# Patient Record
Sex: Male | Born: 1955 | Hispanic: Yes | Marital: Married | Smoking: Former smoker
Health system: Southern US, Community
[De-identification: ages and names within clinical notes are randomized; demographics above are authoritative.]

---

## 2018-12-02 ENCOUNTER — Emergency Department: Payer: BLUE CROSS/BLUE SHIELD

## 2018-12-02 ENCOUNTER — Encounter: Payer: Self-pay | Admitting: Emergency Medicine

## 2018-12-02 ENCOUNTER — Emergency Department
Admission: EM | Admit: 2018-12-02 | Discharge: 2018-12-02 | Disposition: A | Discharge status: Home / Self Care | Payer: BLUE CROSS/BLUE SHIELD | Attending: Emergency Medicine | Admitting: Emergency Medicine

## 2018-12-02 DIAGNOSIS — M47814 Spondylosis without myelopathy or radiculopathy, thoracic region: Secondary | ICD-10-CM

## 2018-12-02 DIAGNOSIS — Y998 Other external cause status: Secondary | ICD-10-CM | POA: Insufficient documentation

## 2018-12-02 DIAGNOSIS — Y92410 Unspecified street and highway as the place of occurrence of the external cause: Secondary | ICD-10-CM | POA: Insufficient documentation

## 2018-12-02 DIAGNOSIS — M545 Low back pain: Secondary | ICD-10-CM | POA: Diagnosis present

## 2018-12-02 DIAGNOSIS — Y9389 Activity, other specified: Secondary | ICD-10-CM | POA: Diagnosis not present

## 2018-12-02 DIAGNOSIS — M47816 Spondylosis without myelopathy or radiculopathy, lumbar region: Secondary | ICD-10-CM | POA: Diagnosis not present

## 2018-12-02 MED ORDER — MELOXICAM 15 MG PO TABS: 15.0000 mg | ORAL_TABLET | Freq: Every day | ORAL | 0 refills | Status: AC

## 2018-12-02 MED ORDER — KETOROLAC TROMETHAMINE 30 MG/ML IJ SOLN
30.0000 mg | Freq: Once | INTRAMUSCULAR | Status: AC
  Administered 2018-12-02: 30 mg via INTRAMUSCULAR
  Filled 2018-12-02: qty 1
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
## 2018-12-02 NOTE — Discharge Instructions (Signed)
Begin taking medication as directed 1 a day.  Follow-up with [facility] acute care if any continued problems.

## 2019-07-30 ENCOUNTER — Emergency Department
Admission: EM | Admit: 2019-07-30 | Discharge: 2019-07-30 | Disposition: A | Discharge status: Home / Self Care | Payer: BC Managed Care – PPO | Attending: Emergency Medicine | Admitting: Emergency Medicine

## 2019-07-30 ENCOUNTER — Other Ambulatory Visit: Payer: Self-pay

## 2019-07-30 ENCOUNTER — Encounter: Payer: Self-pay | Admitting: Emergency Medicine

## 2019-07-30 ENCOUNTER — Emergency Department: Payer: BC Managed Care – PPO

## 2019-07-30 DIAGNOSIS — U071 COVID-19: Secondary | ICD-10-CM | POA: Insufficient documentation

## 2019-07-30 DIAGNOSIS — K859 Acute pancreatitis without necrosis or infection, unspecified: Secondary | ICD-10-CM | POA: Diagnosis not present

## 2019-07-30 DIAGNOSIS — R1010 Upper abdominal pain, unspecified: Secondary | ICD-10-CM | POA: Diagnosis present

## 2019-07-30 DIAGNOSIS — Z87891 Personal history of nicotine dependence: Secondary | ICD-10-CM | POA: Insufficient documentation

## 2019-07-30 LAB — URINALYSIS, COMPLETE (UACMP) WITH MICROSCOPIC
Bacteria, UA: NONE SEEN
Bilirubin Urine: NEGATIVE
Glucose, UA: NEGATIVE mg/dL
Hgb urine dipstick: NEGATIVE
Ketones, ur: NEGATIVE mg/dL
Leukocytes,Ua: NEGATIVE
Nitrite: NEGATIVE
Protein, ur: NEGATIVE mg/dL
Specific Gravity, Urine: 1.023 (ref 1.005–1.030)
pH: 6 (ref 5.0–8.0)

## 2019-07-30 LAB — CBC
HCT: 47.5 % (ref 39.0–52.0)
Hemoglobin: 16 g/dL (ref 13.0–17.0)
MCH: 30.5 pg (ref 26.0–34.0)
MCHC: 33.7 g/dL (ref 30.0–36.0)
MCV: 90.6 fL (ref 80.0–100.0)
Platelets: 215 10*3/uL (ref 150–400)
RBC: 5.24 MIL/uL (ref 4.22–5.81)
RDW: 14.2 % (ref 11.5–15.5)
WBC: 5.9 10*3/uL (ref 4.0–10.5)
nRBC: 0 % (ref 0.0–0.2)

## 2019-07-30 LAB — COMPREHENSIVE METABOLIC PANEL
ALT: 31 U/L (ref 0–44)
AST: 48 U/L — ABNORMAL HIGH (ref 15–41)
Albumin: 3.4 g/dL — ABNORMAL LOW (ref 3.5–5.0)
Alkaline Phosphatase: 75 U/L (ref 38–126)
Anion gap: 11 (ref 5–15)
BUN: 9 mg/dL (ref 8–23)
CO2: 23 mmol/L (ref 22–32)
Calcium: 8.9 mg/dL (ref 8.9–10.3)
Chloride: 107 mmol/L (ref 98–111)
Creatinine, Ser: 0.62 mg/dL (ref 0.61–1.24)
GFR calc Af Amer: >60 mL/min (ref >60)
GFR calc non Af Amer: >60 mL/min (ref >60)
Glucose, Bld: 150 mg/dL — ABNORMAL HIGH (ref 70–99)
Potassium: 4.4 mmol/L (ref 3.5–5.1)
Sodium: 141 mmol/L (ref 135–145)
Total Bilirubin: 0.8 mg/dL (ref 0.3–1.2)
Total Protein: 7.5 g/dL (ref 6.5–8.1)

## 2019-07-30 LAB — SARS CORONAVIRUS 2 BY RT PCR (HOSPITAL ORDER, PERFORMED IN ~~LOC~~ HOSPITAL LAB): SARS Coronavirus 2: POSITIVE — AB

## 2019-07-30 LAB — TROPONIN I (HIGH SENSITIVITY)
Troponin I (High Sensitivity): 4 ng/L (ref <18)
Troponin I (High Sensitivity): 4 ng/L (ref <18)

## 2019-07-30 LAB — LIPASE, BLOOD: Lipase: 59 U/L — ABNORMAL HIGH (ref 11–51)

## 2019-07-30 MED ORDER — AZITHROMYCIN 250 MG PO TABS: ORAL_TABLET | ORAL | 0 refills | Status: AC

## 2019-07-30 MED ORDER — IOHEXOL 350 MG/ML SOLN
100.0000 mL | Freq: Once | INTRAVENOUS | Status: AC | PRN
  Administered 2019-07-30: 100 mL via INTRAVENOUS

## 2019-07-30 MED ORDER — ALBUTEROL SULFATE HFA 108 (90 BASE) MCG/ACT IN AERS: 2.0000 | INHALATION_SPRAY | Freq: Four times a day (QID) | RESPIRATORY_TRACT | 0 refills | Status: AC | PRN

## 2019-07-30 MED ORDER — GUAIFENESIN-CODEINE 100-10 MG/5ML PO SOLN: 5.0000 mL | Freq: Four times a day (QID) | ORAL | 0 refills | Status: AC | PRN

## 2019-07-30 NOTE — ED Triage Notes (Signed)
Pt reports epigastric pain for 2 weeks, lost 12 lbs. denies N/V or diarrhea reports unable to eat has been drinking fluids. Reports at times has burning sensation to esophagus. Pt talks in complete sentences, no respiratory distress noted

## 2019-07-30 NOTE — ED Provider Notes (Signed)
[facility] Emergency Department Provider Note  Time seen: 8:32 PM  I have reviewed the triage vital signs and the nursing notes.   HISTORY  Chief Complaint Abdominal Pain (Epigastric Pain )    HPI Juan Wilcox is a 63 y.o. male with no significant past medical history presents to the emergency department for abdominal pain.  According to the patient he returned from [place] approximately 2 weeks ago, shortly after returning from [place] he developed upper abdominal pain and nausea.  Denies any vomiting or diarrhea.  States he has had no appetite for the past 2 weeks due to the abdominal pain.  Denies any fever or cough, low-grade fever here 99.5.  States mild epigastric pain currently.  Patient does drink alcohol but he states only 1 time per week.   History reviewed. No pertinent past medical history.  There are no active problems to display for this patient.   History reviewed. No pertinent surgical history.  Prior to Admission medications   Medication Sig Start Date End Date Taking? Authorizing Provider  meloxicam (MOBIC) 15 MG tablet Take 1 tablet (15 mg total) by mouth daily. 12/02/18 12/02/19  Johnn Hai, PA-C    Not on File  No family history on file.  Social History Social History   Tobacco Use  . Smoking status: Former Smoker  Substance Use Topics  . Alcohol use: Yes  . Drug use: Not on file    Review of Systems Constitutional: Negative for fever. Cardiovascular: Negative for chest pain. Respiratory: Negative for shortness of breath.  Negative for cough. Gastrointestinal: Positive for epigastric pain.  Negative for vomiting or diarrhea Genitourinary: Negative for urinary compaints Musculoskeletal: Negative for musculoskeletal complaints Neurological: Negative for headache All other ROS negative  ____________________________________________   PHYSICAL EXAM:  VITAL SIGNS: ED Triage Vitals  Enc Vitals Group     BP 07/30/19  1906 120/74     Pulse Rate 07/30/19 1906 88     Resp 07/30/19 1906 20     Temp 07/30/19 1906 99.5 F (37.5 C)     Temp Source 07/30/19 1906 Oral     SpO2 07/30/19 1906 95 %     Weight 07/30/19 1907 220 lb (99.8 kg)     Height 07/30/19 1907 6' (1.829 m)     Head Circumference --      Peak Flow --      Pain Score 07/30/19 1907 3     Pain Loc --      Pain Edu? --      Excl. in [place]? --    Constitutional: Alert and oriented. Well appearing and in no distress. Eyes: Normal exam ENT      Head: Normocephalic and atraumatic.      Mouth/Throat: Mucous membranes are moist. Cardiovascular: Normal rate, regular rhythm. No murmur Respiratory: Normal respiratory effort without tachypnea nor retractions. Breath sounds are clear  Gastrointestinal: Soft, mild epigastric tenderness to palpation no rebound guarding or distention. Musculoskeletal: Nontender with normal range of motion in all extremities. Neurologic:  Normal speech and language. No gross focal neurologic deficits Skin:  Skin is warm, dry and intact.  Psychiatric: Mood and affect are normal.   ____________________________________________    EKG  EKG viewed and interpreted by myself shows a normal sinus rhythm 83 bpm with a narrow QRS, normal axis, normal intervals, no concerning ST changes.    INITIAL IMPRESSION / ASSESSMENT AND PLAN / ED COURSE  Pertinent labs & imaging results that were available  during my care of the patient were reviewed by me and considered in my medical decision making (see chart for details).   Patient presents to the emergency department for upper abdominal pain, decreased appetite found to have a borderline fever.  Patient just returned from [place] prior to his sympGrenadatoms starting.  We will check labs and continue to closely monitor.  Patient's labs are resulted showing an elevated lipase in the 50s, possibly indicating mild pancreatitis.  Troponin is negative.  LFTs are nonrevealing with a normal total  bilirubin.  We will obtain CT imaging of the abdomen/pelvis to further evaluate.  Given his low-grade temperature and recent return from [place] we will also test for coronavirus.  Patient is coronavirus test is positive.  Mild lipase elevation.  Discussed with the patient avoiding alcohol, we will discharge with Zithromax given patchy opacities seen on CT scan, albuterol and codeine cough medication/pain medication.  Patient continues to sat in the mid upper 90s on room air I believe the patient is safe for discharge home.  Juan Wilcox was evaluated in Emergency Department on 07/30/2019 for the symptoms described in the history of present illness. He was evaluated in the context of the global COVID-19 pandemic, which necessitated consideration that the patient might be at risk for infection with the SARS-CoV-2 virus that causes COVID-19. Institutional protocols and algorithms that pertain to the evaluation of patients at risk for COVID-19 are in a state of rapid change based on information released by regulatory bodies including the CDC and federal and state organizations. These policies and algorithms were followed during the patient's care in the ED.  ____________________________________________   FINAL CLINICAL IMPRESSION(S) / ED DIAGNOSES  Mild pancreatitis COVID-19     Minna AntisPaduchowski, Edson Deridder, MD 07/30/19 2302

## 2019-07-30 NOTE — ED Notes (Signed)
Daughter advised that she may need to be tested and quarantine until negative result.

## 2019-07-30 NOTE — Discharge Instructions (Addendum)
Please avoid any alcohol.  Please take your medications as prescribed.  Return to the emergency department for any difficulty breathing, worsening symptoms, or any other symptom personally concerning to yourself.  Please quarantine yourself for the next 14 days.

## 2019-07-30 NOTE — ED Notes (Signed)
Pt reports recently travel from [place] about 11 days ago and symptoms started 07/19/2019

## 2020-02-25 ENCOUNTER — Ambulatory Visit: Payer: BC Managed Care – PPO | Attending: Internal Medicine

## 2020-02-25 DIAGNOSIS — Z23 Encounter for immunization: Secondary | ICD-10-CM

## 2020-02-25 NOTE — Progress Notes (Signed)
   Covid-19 Vaccination Clinic  Name:  Zamar Odwyer    MRN: [id number] DOB: 27-Dec-1955  02/25/2020  Mr. Sanders was observed post Covid-19 immunization for 15 minutes without incident. He was provided with Vaccine Information Sheet and instruction to access the V-Safe system.   Mr. Delcarlo was instructed to call 911 with any severe reactions post vaccine: Marland Kitchen Difficulty breathing  . Swelling of face and throat  . A fast heartbeat  . A bad rash all over body  . Dizziness and weakness   Immunizations Administered    Name Date Dose VIS Date Route   Pfizer COVID-19 Vaccine 02/25/2020  9:53 AM 0.3 mL 11/05/2019 Intramuscular   Manufacturer: [employer], [employer]   Lot: [id number]   NDC: [address]-4

## 2020-03-22 ENCOUNTER — Ambulatory Visit: Payer: BC Managed Care – PPO | Attending: Internal Medicine

## 2020-03-22 DIAGNOSIS — Z23 Encounter for immunization: Secondary | ICD-10-CM

## 2020-03-22 NOTE — Progress Notes (Signed)
   Covid-19 Vaccination Clinic  Name:  Juan Wilcox    MRN: [id number] DOB: Oct 27, 1956  03/22/2020  Mr. Juan Wilcox was observed post Covid-19 immunization for 15 minutes without incident. He was provided with Vaccine Information Sheet and instruction to access the V-Safe system.   Mr. Juan Wilcox was instructed to call 911 with any severe reactions post vaccine: Marland Kitchen Difficulty breathing  . Swelling of face and throat  . A fast heartbeat  . A bad rash all over body  . Dizziness and weakness   Immunizations Administered    Name Date Dose VIS Date Route   Pfizer COVID-19 Vaccine 03/22/2020 10:12 AM 0.3 mL 01/19/2019 Intramuscular   Manufacturer: [employer], [employer]   Lot: [id number]   NDC: [address]-5

## 2021-02-05 IMAGING — CT CT ABD-PELV W/ CM
2 of 5 series · 15 of 46 positions shown, 17 images · IV contrast (APPLIED)
Comparison: None.

CLINICAL DATA: Epigastric pain

EXAM:
CT ABDOMEN AND PELVIS WITH CONTRAST
TECHNIQUE: Multidetector CT imaging of the abdomen and pelvis was performed
using the standard protocol following bolus administration of
intravenous contrast.
CONTRAST:  100mL OMNIPAQUE IOHEXOL 350 MG/ML SOLN

[Series 2: axial st · axial · 0.88mm/px · z∈[-805,-355]mm · 12 of 101 slices shown, 14 images]
[im 6/101  soft-tissue]
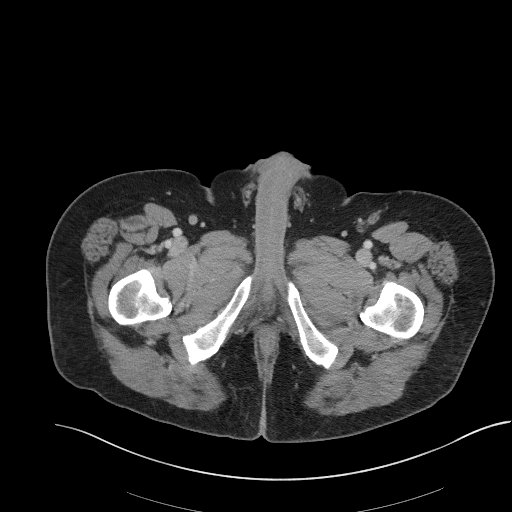
[im 6/101  bone]
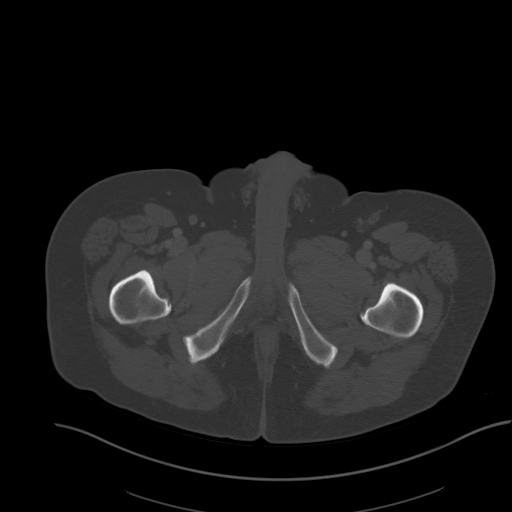
[im 16/101  soft-tissue]
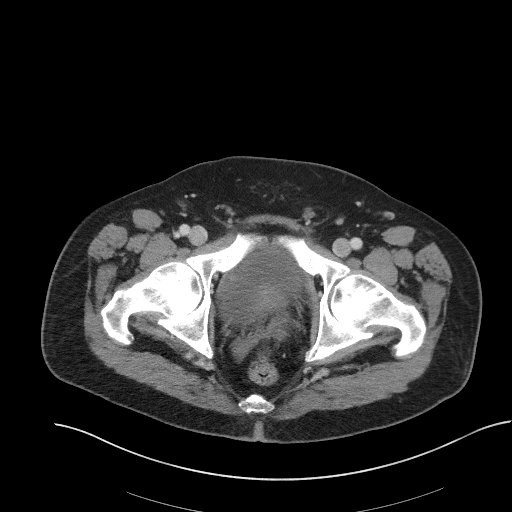
[im 21/101  soft-tissue]
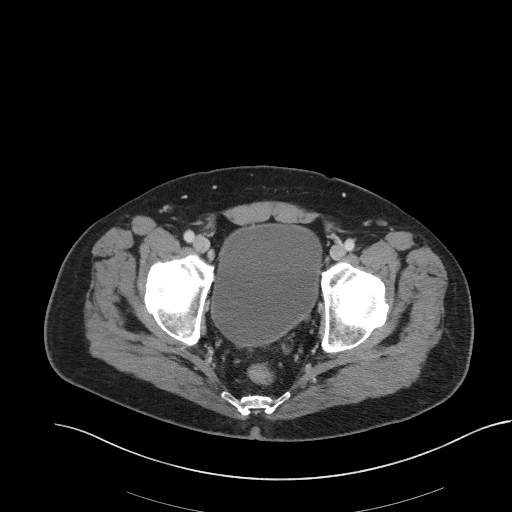
[im 31/101  soft-tissue]
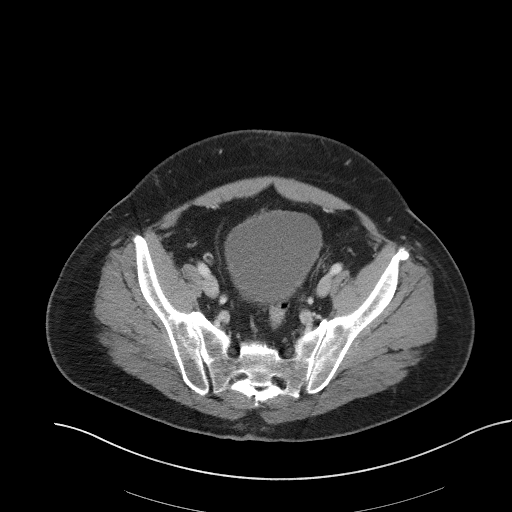
[im 41/101  soft-tissue]
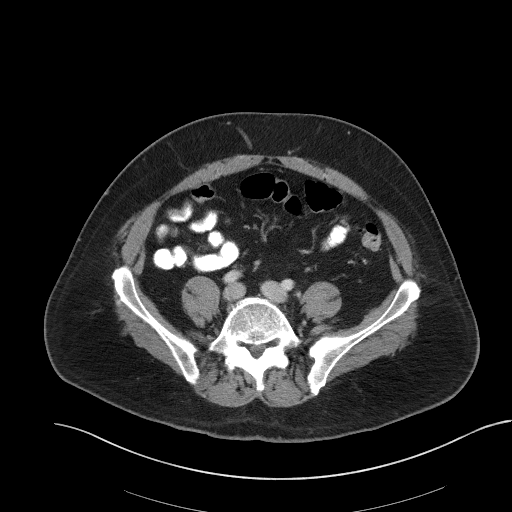
[im 46/101  soft-tissue]
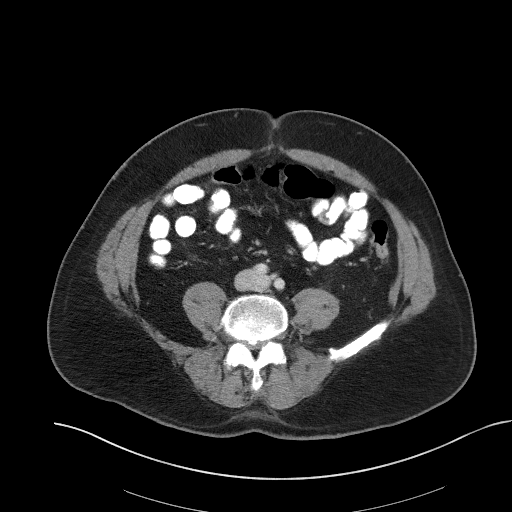
[im 56/101  soft-tissue]
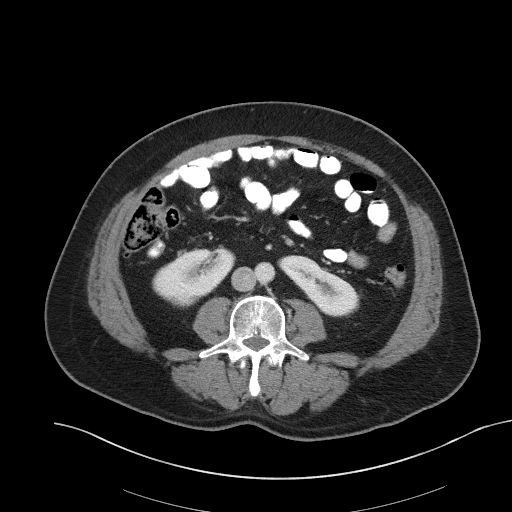
[im 61/101  soft-tissue]
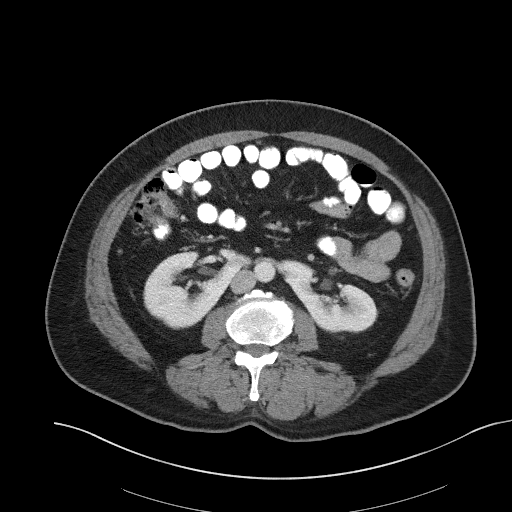
[im 71/101  soft-tissue]
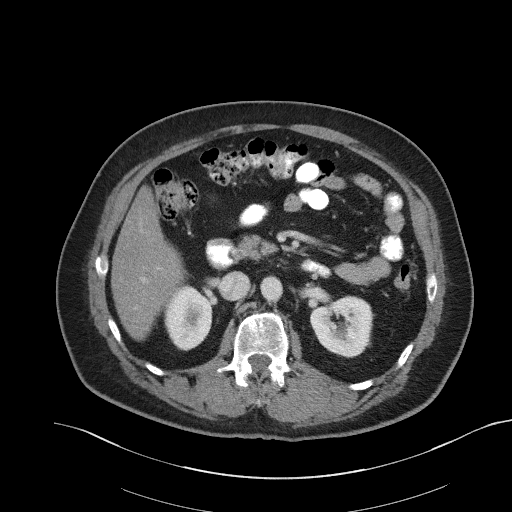
[im 71/101  bone]
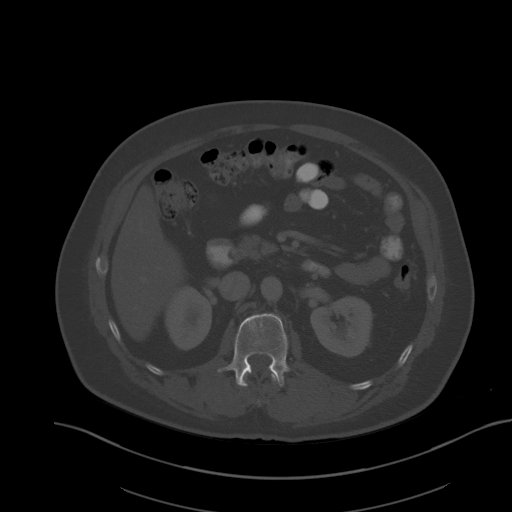
[im 81/101  soft-tissue]
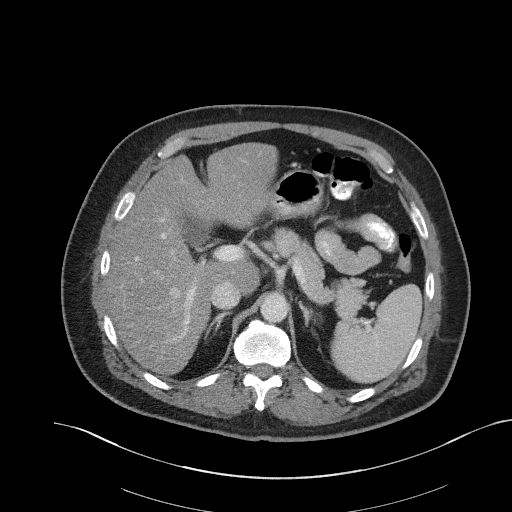
[im 86/101  soft-tissue]
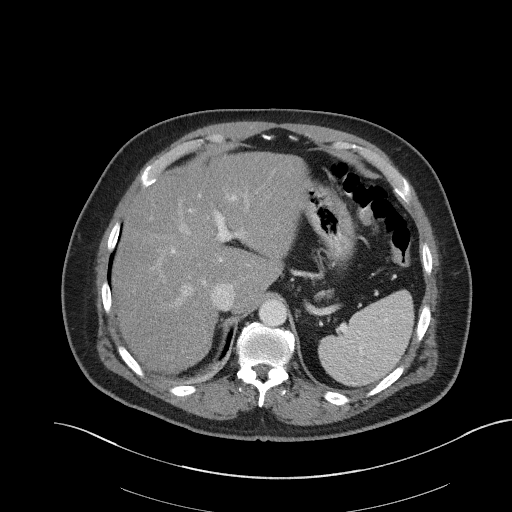
[im 96/101  soft-tissue]
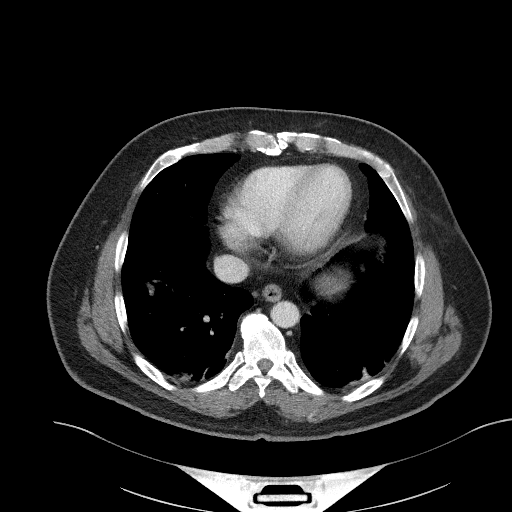

[Series 5: coronal st · coronal · 0.77mm/px · 3 of 91 slices shown]
[im 31/91  soft-tissue]
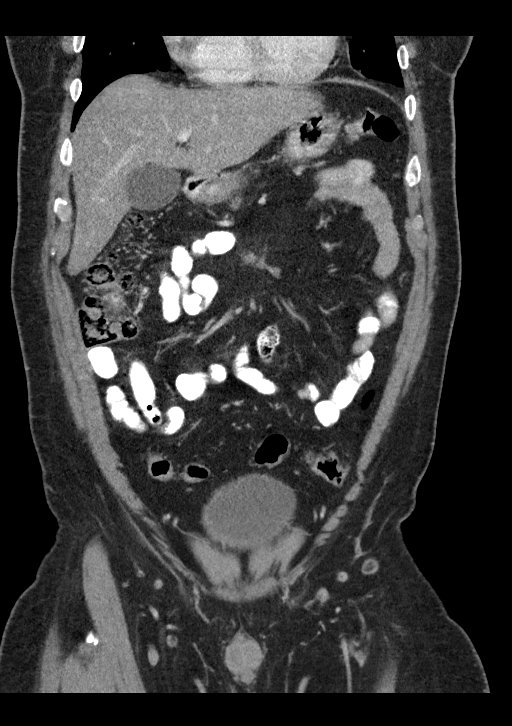
[im 41/91  soft-tissue]
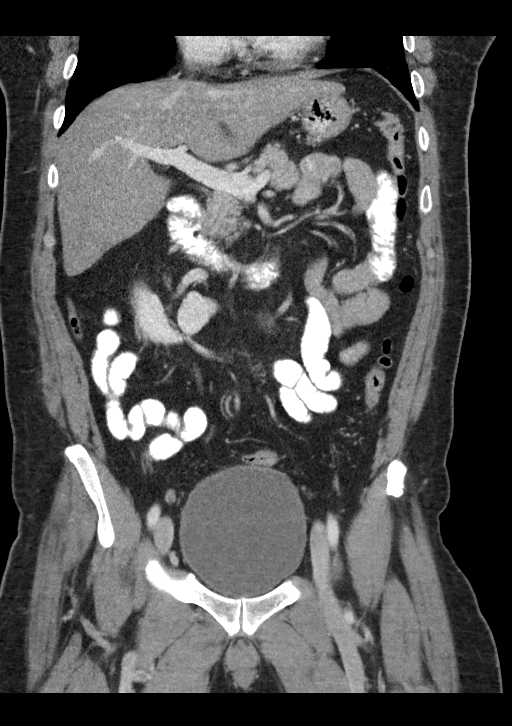
[im 51/91  soft-tissue]
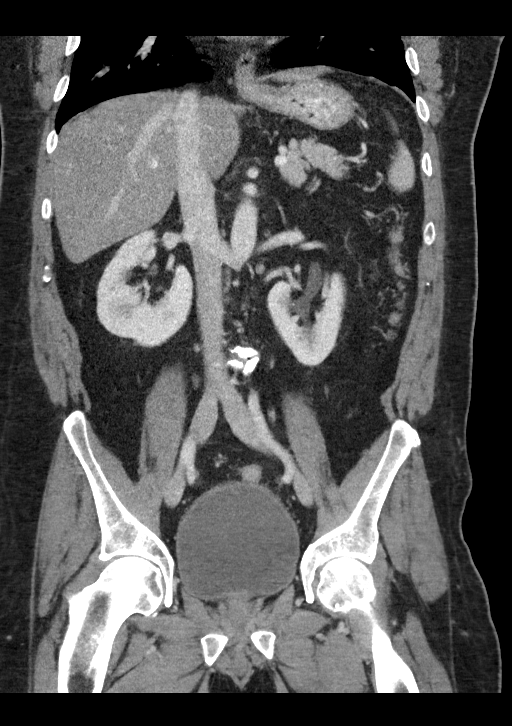

[15 of 46 positions shown; findings below may reference images not displayed]

FINDINGS: Lower chest: Lung bases demonstrate patchy subpleural ground-glass
densities and bandlike consolidations in addition to round focus of
consolidation in the left lower lobe. No pleural effusion. Heart
size within normal limits.

Hepatobiliary: Hepatic steatosis. No calcified gallstone or biliary
dilatation

Pancreas: Unremarkable. No pancreatic ductal dilatation or
surrounding inflammatory changes.

Spleen: Normal in size without focal abnormality.

Adrenals/Urinary Tract: Horseshoe kidney with thin fibrous band
connecting the medial lower poles of the kidneys. Prominence of the
renal collecting systems without frank hydronephrosis or
hydroureter. Slight urinary bladder distention

Stomach/Bowel: Stomach is within normal limits. Appendix appears
normal. No evidence of bowel wall thickening, distention, or
inflammatory changes.

Vascular/Lymphatic: No significant vascular findings are present. No
enlarged abdominal or pelvic lymph nodes.

Reproductive: Prostate calcification

Other: Negative for free air or free fluid

Musculoskeletal: Degenerative changes of the spine. No acute or
suspicious osseous abnormality.
IMPRESSION: 1. Patchy peripheral ground-glass densities and consolidations, some
of which demonstrates bandlike and rounded morphology. There are a
spectrum of findings in the lungs which can be seen with acute
atypical infection (as well as other non-infectious etiologies). In
particular, viral pneumonia (including [id number]) should be
considered in the appropriate clinical setting.

2. Hepatic steatosis. Otherwise no CT evidence for acute
intra-abdominal or pelvic abnormality.

3.   Horseshoe kidney
